# Patient Record
Sex: Male | Born: 1995 | Race: White | Hispanic: No | Marital: Single | Smoking: Never smoker
Health system: Southern US, Community
[De-identification: ages and names within clinical notes are randomized; demographics above are authoritative.]

---
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
## 2016-10-29 ENCOUNTER — Emergency Department (HOSPITAL_BASED_OUTPATIENT_CLINIC_OR_DEPARTMENT_OTHER): Payer: BLUE CROSS/BLUE SHIELD

## 2016-10-29 ENCOUNTER — Encounter (HOSPITAL_BASED_OUTPATIENT_CLINIC_OR_DEPARTMENT_OTHER): Payer: Self-pay

## 2016-10-29 ENCOUNTER — Emergency Department (HOSPITAL_BASED_OUTPATIENT_CLINIC_OR_DEPARTMENT_OTHER)
Admission: EM | Admit: 2016-10-29 | Discharge: 2016-10-29 | Disposition: A | Discharge status: Home / Self Care | Payer: BLUE CROSS/BLUE SHIELD | Attending: Emergency Medicine | Admitting: Emergency Medicine

## 2016-10-29 DIAGNOSIS — W219XXA Striking against or struck by unspecified sports equipment, initial encounter: Secondary | ICD-10-CM | POA: Insufficient documentation

## 2016-10-29 DIAGNOSIS — Y9367 Activity, basketball: Secondary | ICD-10-CM | POA: Insufficient documentation

## 2016-10-29 DIAGNOSIS — Y929 Unspecified place or not applicable: Secondary | ICD-10-CM | POA: Insufficient documentation

## 2016-10-29 DIAGNOSIS — S93401A Sprain of unspecified ligament of right ankle, initial encounter: Secondary | ICD-10-CM | POA: Insufficient documentation

## 2016-10-29 DIAGNOSIS — S99911A Unspecified injury of right ankle, initial encounter: Secondary | ICD-10-CM | POA: Diagnosis present

## 2016-10-29 DIAGNOSIS — Y999 Unspecified external cause status: Secondary | ICD-10-CM | POA: Diagnosis not present

## 2016-10-29 MED ORDER — IBUPROFEN 800 MG PO TABS
800.0000 mg | ORAL_TABLET | Freq: Once | ORAL | Status: AC
  Administered 2016-10-29: 800 mg via ORAL
  Filled 2016-10-29: qty 1

## 2016-10-29 NOTE — Discharge Instructions (Signed)
Ankle sprain  Your caregiver has diagnosed you as suffering from an ankle sprain. Ankle sprain occurs when the ligaments that hold the ankle joint to get her are stretched or torn. It may take 4-6 weeks to heal.  For activity: Use crutches with nonweightbearing for the first few days. Then, you may walk on your ankles as the pain allows, or as instructed. Start gradually with weight bearing on the affected ankle. Once you can walk pain free, then try jogging. When you can run forwards, then you can try moving side to side. If you cannot walk without crutches in one week, you need a recheck by your Family Doctor.  Seek immediate medical attention if: You're toes are numb or tingling, appear gray or blue, or you have severe pain (also elevate the leg and loosen the splint if this occurs)  If you do not have a family doctor to followup with, you can see the list of phone numbers below. Please call today to make a followup appointment. Otherwise please follow up with your PCP>    RICE therapy:  Routine Care for injuries  Rest, Ice, Compression, Elevation (RICE)  Rest is needed to allow your body to heal. Routine activities can be resumed when comfortable. Injury tendons and bones can take up to 6 weeks to heal. Tendons are cordlike structures that attach muscles and bones.  Ice following an injury helps keep the swelling down and reduce the pain. Put ice in a plastic bag. Place a towel between your skin and the bag of ice. Leave the ice on for 15-20 minutes, 3-4 times a day. Do this while awake, for the first 24-48 hours. After that continue as directed by your caregiver.  Compression helps keep swelling down. It also gives support and helps with discomfort. If any lasting bandage has been applied, it should be removed and reapplied every 3-4 hours. It should not be applied tightly, but firmly enough to keep swelling down. Watch fingers or toes for swelling, discoloration, coldness, numbness or  excessive pain. If any of these problems occur, removed the bandage and reapply loosely. Contact your caregiver if these problems continue.  Elevation helps reduce swelling and decrease your pain. With extremities such as the arms, hands, legs and feet, the injured area should be placed near or above the level of the heart if possible.  Seek immediate medical care if you have persistent pain and swelling, developed redness numbness or unexpected weakness, your symptoms are getting worse rather than improving after several days. The symptoms may indicate that further evaluation or further x-rays are needed. Sometimes, x-rays may not show a small broken bone until one week or 10 days later. Make a followup appointment with your caregiver. Ask when your x-ray results will be ready. Make sure you get your x-ray results  RESOURCE GUIDE  Dental Problems  Patients with Medicaid: [facility]                     [facility] [phone]                                           [phone] [address] Phone:  [phone]  Phone:  [phone]  If unable to pay or uninsured, contact:  [facility] or [facility] to become qualified for the adult dental clinic.  Chronic Pain Problems Contact [facility]  [phone] Patients need to be referred by their primary care doctor.  Insufficient Money for Medicine Contact [employer]:  call "211" or [facility] 678-353-9450.  No Primary Care Doctor Call [facility]  9785715600 Other agencies that provide inexpensive medical care    [facility]  [phone]    [facility]  [phone]    [facility]  [phone]    [facility]  [phone]    [facility]  [phone]    [facility]  [phone]  Psychological Services [facility]  [phone] [facility]  [phone] [facility]  Health   [phone] (emergency services [phone])  Substance Abuse Resources [facility]  [phone] [facility] 7626664940 [facility] [phone] Floydene Flock [phone] Residential & Outpatient Substance Abuse Program  [phone]  Abuse/Neglect [facility] Child Abuse Hotline 279-537-2233 [facility] Child Abuse Hotline (989) 423-3232 (After Hours)  Emergency Shelter [facility] [phone]  Maternity Homes [facility] 267-739-6596 [facility] 9097674726  MRSA Hotline #:   952 351 7735    [facility] Resources  [facility]     [employer]                          [facility]. [address] [place]                       [address]      [address]  Blondell Reveal Phone:  [phone]                                   Phone:  [phone]                 Phone:  [phone]  [facility] Phone:  [phone]  [facility] Child Abuse Hotline (782) [phone] (After Hours)

## 2016-10-29 NOTE — ED Triage Notes (Signed)
C/o twisted right ankle approx 1 hour PTA-ice PTA-presents to traige on crutches

## 2016-10-29 NOTE — ED Notes (Signed)
Patient transported to X-ray 

## 2016-10-29 NOTE — ED Provider Notes (Signed)
[facility] Note   CSN: [id number] Arrival date & time: 10/29/16  2006  By signing my name below, I, Cynda Acres, attest that this documentation has been prepared under the direction and in the presence of [employer], PA-C. Electronically Signed: Cynda Acres, Scribe. 10/29/16. 9:24 PM.  History   Chief Complaint Chief Complaint  Patient presents with  . Ankle Injury   HPI Comments: Sean Guerrero is a 21 y.o. male with no apparent past medical history, who presents to the Emergency Department complaining of persistent right ankle pain that began one hour prior to arrival. Patient reports playing basketball earlier tonight, he was attempting to do a layup when he hit his ankle on the basketball goal pole. Patient reports gradually worsening 5/10 pain. States pain is Mild at present. Patient reports associated swelling along lateral ankle. No medications taken prior to arrival. Patient was ambulatory with crutches after the incident. Patient denies any fever, chills, numbness, tingling, weakness, or any additional symptoms.   The history is provided by the patient. No language interpreter was used.    History reviewed. No pertinent past medical history.  There are no active problems to display for this patient.   History reviewed. No pertinent surgical history.     Home Medications    Prior to Admission medications   Not on File    Family History No family history on file.  Social History Social History  Substance Use Topics  . Smoking status: Never Smoker  . Smokeless tobacco: Never Used  . Alcohol use No     Allergies   Penicillins   Review of Systems Review of Systems  Constitutional: Negative for chills and fever.  Musculoskeletal: Positive for arthralgias (right ankle ) and joint swelling (right ankle). Negative for back pain and gait problem.  Neurological: Negative for weakness and numbness.     Physical Exam Updated Vital  Signs BP 131/69   Pulse 81   Temp 98.6 F (37 C) (Oral)   Resp 16   Ht 6' (1.829 m)   Wt 102.5 kg (226 lb)   SpO2 100%   BMI 30.65 kg/m   Physical Exam  Constitutional: He appears well-developed and well-nourished.  Non-toxic appearing.   HENT:  Head: Normocephalic and atraumatic.  Right Ear: External ear normal.  Left Ear: External ear normal.  Eyes: Conjunctivae are normal. Right eye exhibits no discharge. Left eye exhibits no discharge. No scleral icterus.  Pulmonary/Chest: Effort normal. No respiratory distress.  Musculoskeletal: Normal range of motion. He exhibits edema and tenderness. He exhibits no deformity.       Right knee: Normal.       Right ankle: He exhibits swelling (lateral malleolus ). He exhibits no ecchymosis and no deformity. Decreased range of motion:  painful, but limited and able. Tenderness. Lateral malleolus tenderness found. No medial malleolus and no head of 5th metatarsal tenderness found. Achilles tendon normal. Achilles tendon exhibits normal Thompson's test results.  neurovascularly intact. Compartments soft.   Neurological: He is alert.  Skin: No pallor.  Psychiatric: He has a normal mood and affect.  Nursing note and vitals reviewed.    ED Treatments / Results  DIAGNOSTIC STUDIES: Oxygen Saturation is 97% on RA, normal by my interpretation.    COORDINATION OF CARE: 9:21 PM Discussed treatment plan with pt at bedside and pt agreed to plan, which includes ibuprofen, crutches, and a brace.   Labs (all labs ordered are listed, but only abnormal results are displayed) Labs  Reviewed - No data to display  EKG  EKG Interpretation None       Radiology Dg Ankle Complete Right  Result Date: 10/29/2016 CLINICAL DATA:  Initial encounter for Rt ankle twisting injury x today during basketball and landing on base of goal post. Pt has pain with swelling at lateral malleolus. HX: slight sprains but no fx or surgeries known. shielded EXAM: RIGHT  ANKLE - COMPLETE 3+ VIEW COMPARISON:  12/13/2009 tibia/ fibula films. FINDINGS: Soft tissue swelling about the lateral malleolus and distal fibular shaft. No acute fracture or dislocation. Base of fifth metatarsal and talar dome intact. Mild tibiotalar osteoarthritis anteriorly. Os trigonum. IMPRESSION: Lateral soft tissue swelling, without acute osseous abnormality. Electronically Signed   By: Jeronimo GreavesKyle  Talbot M.D.   On: 10/29/2016 20:41    Procedures Procedures (including critical care time)  Medications Ordered in ED Medications  ibuprofen (ADVIL,MOTRIN) tablet 800 mg (800 mg Oral Given 10/29/16 2131)     Initial Impression / Assessment and Plan / ED Course  I have reviewed the triage vital signs and the nursing notes.  Pertinent labs & imaging results that were available during my care of the patient were reviewed by me and considered in my medical decision making (see chart for details).       21 year old male presenting after injury to right ankle while playing basketball this evening. Vital signs reassuring. Patient with tenderness and swelling over lateral malleolus on exam. Patient X-Ray negative for obvious fracture or dislocation. Pain managed in ED. Pt advised to follow up with PCP or orthopedics if symptoms persist for possibility of missed fracture diagnosis. Patient given brace, crutches, and ibuprofen while in ED, conservative therapy recommended and discussed. Patient will be discharged home & is agreeable with above plan.   Final Clinical Impressions(s) / ED Diagnoses   Final diagnoses:  Sprain of right ankle, unspecified ligament, initial encounter    New Prescriptions There are no discharge medications for this patient.  I personally performed the services described in this documentation, which was scribed in my presence. The recorded information has been reviewed and is accurate.       Princella PellegriniMaczis, Aziya Arena M, PA-C 10/30/16 [id number]    Arby BarrettePfeiffer, Marcy, MD 11/06/16  2117

## 2016-10-29 NOTE — ED Notes (Signed)
EMT at bedside for application of ASO and Crutches

## 2019-01-31 IMAGING — DX DG ANKLE COMPLETE 3+V*R*
3 series · 3 of 3 positions shown · non-contrast
Comparison: 12/13/2009 tibia/ fibula films.

CLINICAL DATA: Initial encounter for Rt ankle twisting injury x
today during basketball and landing on base of goal post. Pt has
pain with swelling at lateral malleolus. HX: slight sprains but no
fx or surgeries known. shielded

EXAM:
RIGHT ANKLE - COMPLETE 3+ VIEW

[ankle ap]
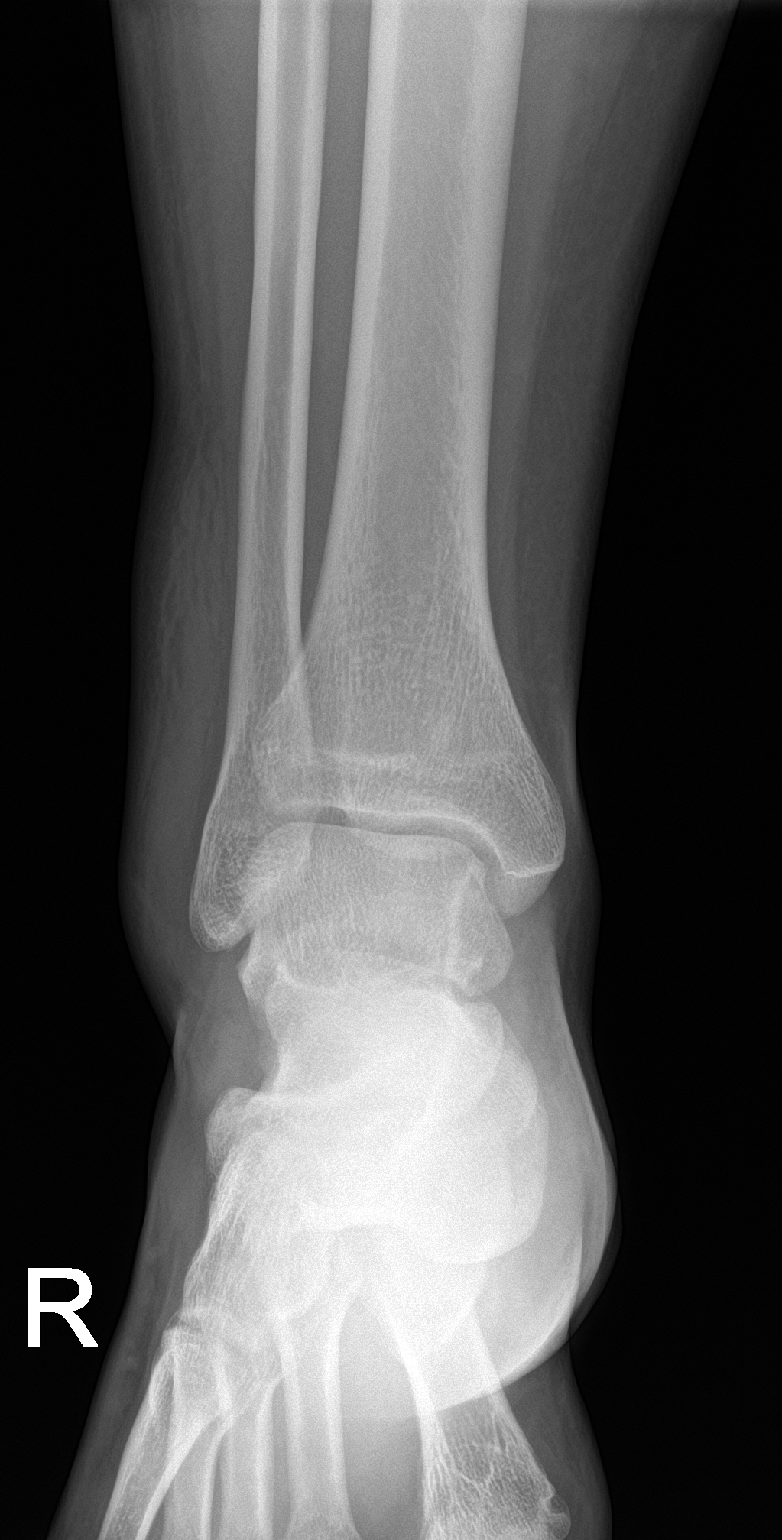

[ankle obl]
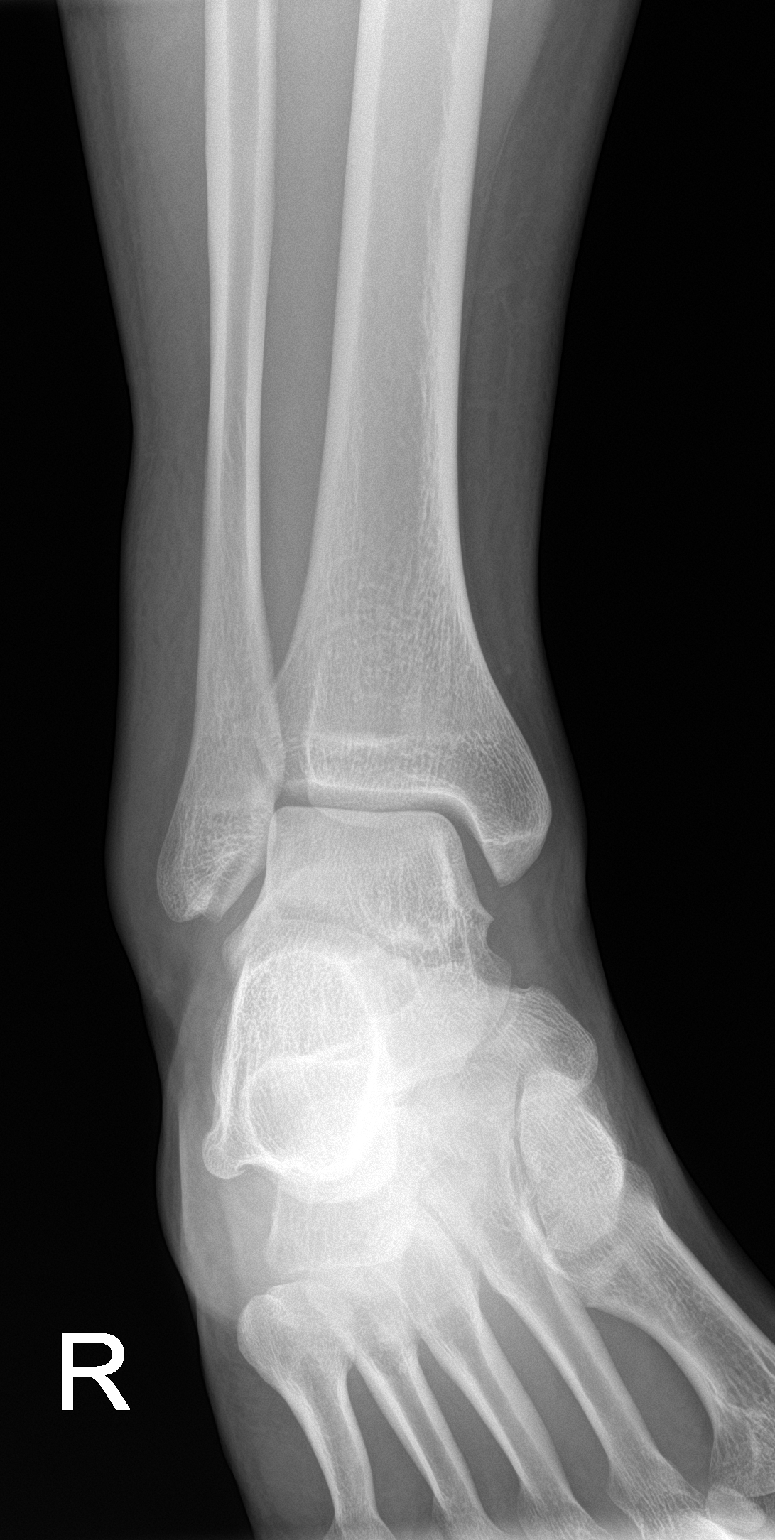

[ankle lat]
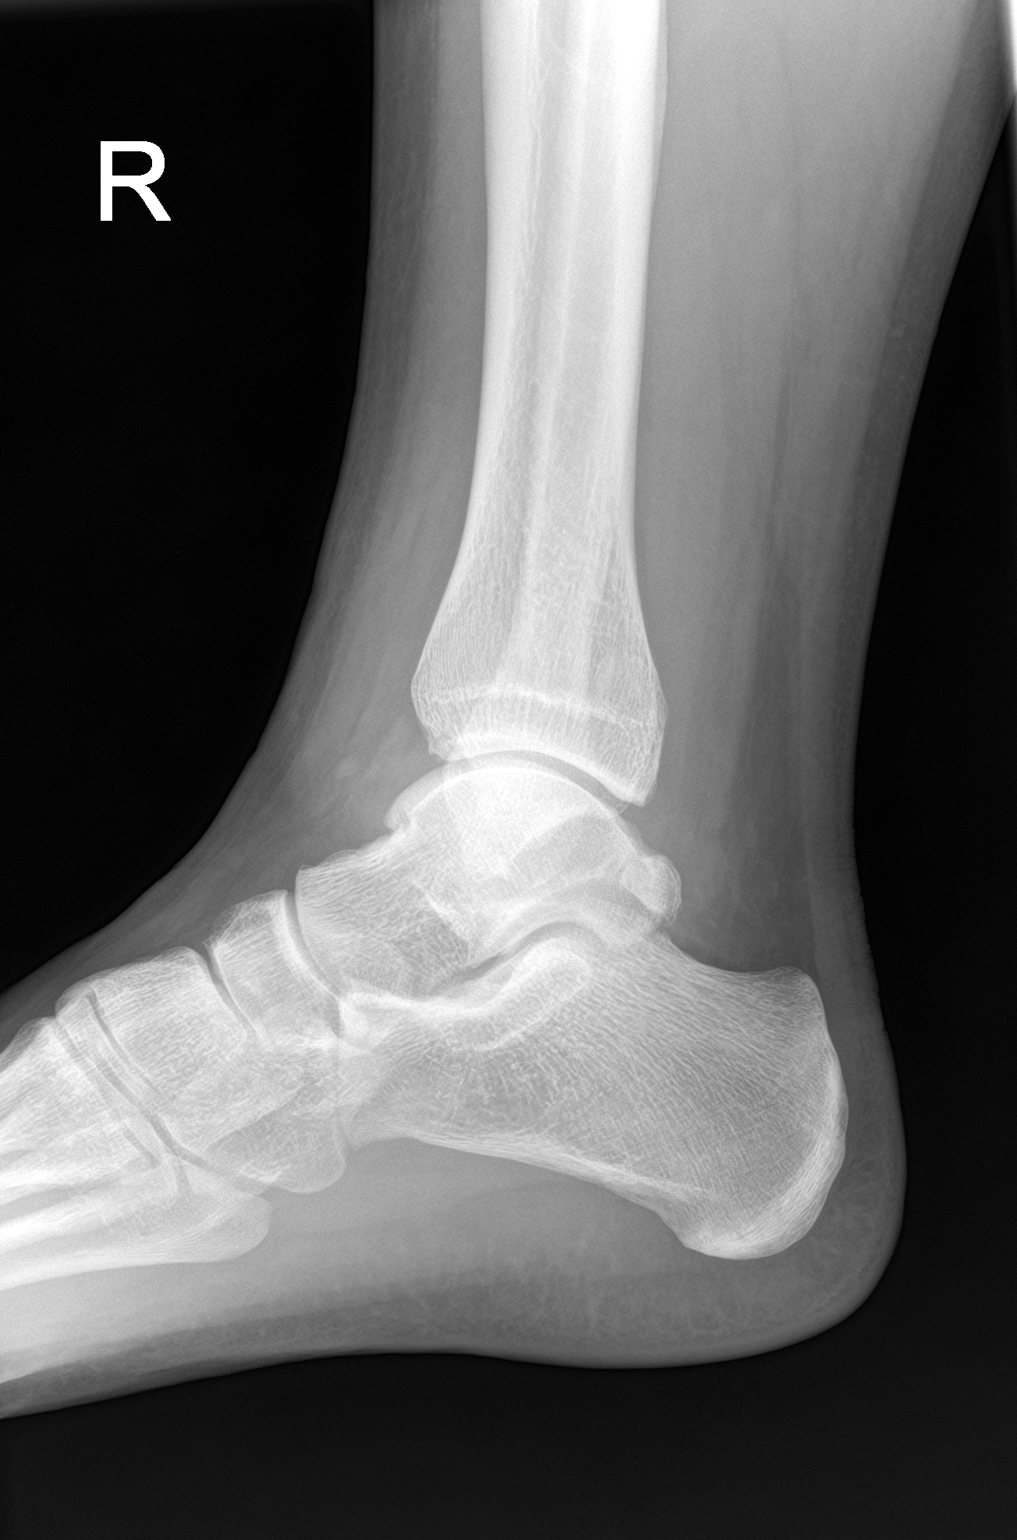

[3 of 3 positions shown; findings below may reference images not displayed]

FINDINGS: Soft tissue swelling about the lateral malleolus and distal fibular
shaft. No acute fracture or dislocation. Base of fifth metatarsal
and talar dome intact. Mild tibiotalar osteoarthritis anteriorly. Os
trigonum.
IMPRESSION: Lateral soft tissue swelling, without acute osseous abnormality.
